# Patient Record
Sex: Female | Born: 1995
Health system: Southern US, Community
[De-identification: ages and names within clinical notes are randomized; demographics above are authoritative.]

## PROBLEM LIST (undated history)

## (undated) DIAGNOSIS — R42 Dizziness and giddiness: Secondary | ICD-10-CM

## (undated) DIAGNOSIS — G43909 Migraine, unspecified, not intractable, without status migrainosus: Secondary | ICD-10-CM

## (undated) HISTORY — DX: Dizziness and giddiness: R42

## (undated) HISTORY — DX: Migraine, unspecified, not intractable, without status migrainosus: G43.909

---

## 2020-04-28 ENCOUNTER — Ambulatory Visit
Admission: EM | Admit: 2020-04-28 | Discharge: 2020-04-28 | Disposition: A | Discharge status: Home / Self Care | Payer: Medicaid Other | Attending: Physician Assistant | Admitting: Physician Assistant

## 2020-04-28 ENCOUNTER — Ambulatory Visit: Admit: 2020-04-28 | Discharge status: Absent without leave | Payer: Self-pay | Source: Home / Self Care

## 2020-04-28 DIAGNOSIS — J029 Acute pharyngitis, unspecified: Secondary | ICD-10-CM

## 2020-04-28 LAB — POCT RAPID STREP A (OFFICE): Rapid Strep A Screen: NEGATIVE

## 2020-04-28 NOTE — Discharge Instructions (Signed)
Return if any problems.

## 2020-04-28 NOTE — ED Triage Notes (Signed)
Pt presents with complaints of nausea, and sore throat. Reports pain is on the left side and her neck is tender to touch. Reports she saw a white area on her left tonsil at home. Reports her stomach was upset last night but she did eat a lot of different things over the holidays.

## 2020-04-30 LAB — COVID-19, FLU A+B NAA
Influenza A, NAA: NOT DETECTED
Influenza B, NAA: NOT DETECTED
SARS-CoV-2, NAA: NOT DETECTED

## 2020-05-03 NOTE — ED Provider Notes (Signed)
[facility]    CSN: [id number] Arrival date & time: 04/28/20  3220      History   Chief Complaint Chief Complaint  Patient presents with  . Sore Throat    HPI Holly Rice is a 25 y.o. female.   The history is provided by the patient. No language interpreter was used.  Sore Throat This is a new problem. The current episode started 2 days ago. The problem occurs constantly. The problem has been gradually worsening. Nothing aggravates the symptoms. Nothing relieves the symptoms. She has tried nothing for the symptoms. The treatment provided no relief.    History reviewed. No pertinent past medical history.  There are no problems to display for this patient.   History reviewed. No pertinent surgical history.  OB History   No obstetric history on file.      Home Medications    Prior to Admission medications   Not on File    Family History History reviewed. No pertinent family history.  Social History Social History   Tobacco Use  . Smoking status: Never Smoker  . Smokeless tobacco: Never Used     Allergies   Aleve [naproxen]   Review of Systems Review of Systems  All other systems reviewed and are negative.    Physical Exam Triage Vital Signs ED Triage Vitals  Enc Vitals Group     BP 04/28/20 0911 104/73     Pulse Rate 04/28/20 0911 90     Resp 04/28/20 0911 15     Temp 04/28/20 0911 99.2 F (37.3 C)     Temp src --      SpO2 04/28/20 0911 98 %     Weight --      Height --      Head Circumference --      Peak Flow --      Pain Score 04/28/20 0919 2     Pain Loc --      Pain Edu? --      Excl. in GC? --    No data found.  Updated Vital Signs BP 104/73   Pulse 90   Temp 99.2 F (37.3 C)   Resp 15   SpO2 98%   Visual Acuity Right Eye Distance:   Left Eye Distance:   Bilateral Distance:    Right Eye Near:   Left Eye Near:    Bilateral Near:     Physical Exam Vitals and nursing note reviewed.   Constitutional:      Appearance: She is well-developed and well-nourished.  HENT:     Head: Normocephalic.     Mouth/Throat:     Mouth: Mucous membranes are moist.     Pharynx: Posterior oropharyngeal erythema present.  Eyes:     Extraocular Movements: EOM normal.  Cardiovascular:     Rate and Rhythm: Normal rate.  Pulmonary:     Effort: Pulmonary effort is normal.  Abdominal:     General: There is no distension.  Musculoskeletal:        General: Normal range of motion.     Cervical back: Normal range of motion.  Neurological:     Mental Status: She is alert and oriented to person, place, and time.  Psychiatric:        Mood and Affect: Mood and affect normal.      UC Treatments / Results  Labs (all labs ordered are listed, but only abnormal results are displayed) Labs Reviewed  COVID-19, FLU A+B NAA  Narrative:    Test(s) 140142-Influenza A, NAA; 140143-Influenza B, NAA was developed and its performance characteristics determined by [facility]. It has not been cleared or approved by the Food and Drug Administration. Performed at:  [address] [place], [place]  [id number] Lab Director: Jolene Schimke MD, Phone:  [phone]  POCT RAPID STREP A (OFFICE)    EKG   Radiology No results found.  Procedures Procedures (including critical care time)  Medications Ordered in UC Medications - No data to display  Initial Impression / Assessment and Plan / UC Course  I have reviewed the triage vital signs and the nursing notes.  Pertinent labs & imaging results that were available during my care of the patient were reviewed by me and considered in my medical decision making (see chart for details).     MDM:  Strep is negative, I suspect viral pharyngitis  Final Clinical Impressions(s) / UC Diagnoses   Final diagnoses:  Viral pharyngitis     Discharge Instructions     Return if any problems   ED Prescriptions    None     PDMP  not reviewed this encounter.  An After Visit Summary was printed and given to the patient.    Elson Areas, [place] 05/03/20 1958

## 2020-09-04 ENCOUNTER — Other Ambulatory Visit: Payer: Self-pay

## 2020-09-04 ENCOUNTER — Ambulatory Visit (INDEPENDENT_AMBULATORY_CARE_PROVIDER_SITE_OTHER): Payer: Medicaid Other

## 2020-09-04 ENCOUNTER — Ambulatory Visit
Admission: EM | Admit: 2020-09-04 | Discharge: 2020-09-04 | Disposition: A | Discharge status: Home / Self Care | Payer: Medicaid Other | Attending: Emergency Medicine | Admitting: Emergency Medicine

## 2020-09-04 DIAGNOSIS — S60450A Superficial foreign body of right index finger, initial encounter: Secondary | ICD-10-CM | POA: Diagnosis not present

## 2020-09-04 DIAGNOSIS — S61259A Open bite of unspecified finger without damage to nail, initial encounter: Secondary | ICD-10-CM

## 2020-09-04 DIAGNOSIS — W5501XA Bitten by cat, initial encounter: Secondary | ICD-10-CM

## 2020-09-04 DIAGNOSIS — L089 Local infection of the skin and subcutaneous tissue, unspecified: Secondary | ICD-10-CM | POA: Diagnosis not present

## 2020-09-04 MED ORDER — AMOXICILLIN-POT CLAVULANATE 875-125 MG PO TABS: 1.0000 | ORAL_TABLET | Freq: Two times a day (BID) | ORAL | 0 refills | Status: AC

## 2020-09-04 MED ORDER — IBUPROFEN 600 MG PO TABS: 600.0000 mg | ORAL_TABLET | Freq: Four times a day (QID) | ORAL | 0 refills | Status: AC | PRN

## 2020-09-04 NOTE — Discharge Instructions (Addendum)
There is no foreign body seen on x-ray.  Take 600 mg of ibuprofen combined with 1000 mg of Tylenol together 3-4 times a day as needed for pain.  There is no foreign body seen on her x-ray.  Finish the Augmentin, even if you feel better.  Go immediately to the emergency department for fevers above 100.4, redness streaking up your hand, pain not controlled with Tylenol/ibuprofen, or for any other concerns.

## 2020-09-04 NOTE — ED Provider Notes (Signed)
HPI  SUBJECTIVE:  Holly Rice is a right-handed 25 y.o. female who presents with pain, erythema, swelling of her right index finger after being bitten by a cat yesterday.  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  She states that this cat has all of its immunizations.  Sustained a bite on the proximal phalanx.  She reports difficulty bending her finger at the PIP secondary to the pain and erythema.  No foreign body sensation, distal numbness or tingling, erythema streaking up the hand.  No body aches, fevers, purulent drainage.  She washed it out with soap and water, has tried alcohol and Betadine without improvement in her symptoms.  Symptoms are worse when she bends her finger.  Her tetanus is up-to-date.  She is not a smoker.  Has no history of diabetes.  LMP: 1 month ago.  Denies possibility being pregnant.  PMD: None.    History reviewed. No pertinent past medical history.  History reviewed. No pertinent surgical history.  Family History  Family history unknown: Yes    Social History   Tobacco Use  . Smoking status: Never Smoker  . Smokeless tobacco: Never Used    No current facility-administered medications for this encounter.  Current Outpatient Medications:  .  amoxicillin-clavulanate (AUGMENTIN) 875-125 MG tablet, Take 1 tablet by mouth 2 (two) times daily for 10 days., Disp: 20 tablet, Rfl: 0 .  ibuprofen (ADVIL) 600 MG tablet, Take 1 tablet (600 mg total) by mouth every 6 (six) hours as needed., Disp: 20 tablet, Rfl: 0  Allergies  Allergen Reactions  . Aleve [Naproxen] Shortness Of Breath     ROS  As noted in HPI.   Physical Exam  BP 109/73   Pulse 74   Temp 98.1 F (36.7 C)   Resp 20   LMP  (LMP Unknown)   SpO2 97%   Constitutional: Well developed, well nourished, no acute distress Eyes:  EOMI, conjunctiva normal bilaterally HENT: Normocephalic, atraumatic,mucus membranes moist Respiratory: Normal inspiratory effort Cardiovascular: Normal rate GI: nondistended skin: See  MSK exam Musculoskeletal: Tender erythema, edema right proximal phalanx of index finger.  Positive puncture wound near the PIP.  No expressible purulent drainage.  Tenderness at the MCP.  No tenderness along the flexor tendon of the hand.  Cap refill distally less than 2 seconds.  Sensation to light touch and temperature grossly intact distally.  Patient able to flex at the DIP, MCP.  Limited flexion at the PIP.       Neurologic: Alert & oriented x 3, no focal neuro deficits Psychiatric: Speech and behavior appropriate   ED Course   Medications - No data to display  Orders Placed This Encounter  Procedures  . DG Finger Index Right    Standing Status:   Standing    Number of Occurrences:   1    Order Specific Question:   Reason for Exam (SYMPTOM  OR DIAGNOSIS REQUIRED)    Answer:   cat bite r/o retained FB    No results found for this or any previous visit (from the past 24 hour(s)). DG Finger Index Right  Result Date: 09/04/2020 CLINICAL DATA:  Cat bite, rule out retained foreign body. EXAM: RIGHT INDEX FINGER 2+V COMPARISON:  None FINDINGS: No radiopaque foreign body in the soft tissues. No acute bone abnormality or sign of dislocation. Soft tissue swelling over the volar aspect of the finger, particularly true over the proximal phalanx of the index finger. IMPRESSION: Soft tissue swelling without acute bone abnormality or radiopaque  foreign body. Electronically Signed   By: Donzetta Kohut M.D.   On: 09/04/2020 12:42    ED Clinical Impression  1. Infected cat bite of finger, initial encounter      ED Assessment/Plan  Will image finger to rule out retained foreign body.  Reviewed imaging independently.  No foreign body.  See radiology report for full details.  Patient status post cat bite to the finger with subsequent infection.  Will send home with Tylenol/ibuprofen.  States that she tolerates ibuprofen without any problem.  10 days Augmentin.  Discussed with her the  unpredictable nature of animal bites, gave her strict ER return precautions.  Will provide primary care list for ongoing care and order assistance in finding a PMD.   Discussed imaging, MDM, treatment plan, and plan for follow-up with patient. Discussed sn/sx that should prompt return to the ED. patient agrees with plan.   Meds ordered this encounter  Medications  . ibuprofen (ADVIL) 600 MG tablet    Sig: Take 1 tablet (600 mg total) by mouth every 6 (six) hours as needed.    Dispense:  20 tablet    Refill:  0  . amoxicillin-clavulanate (AUGMENTIN) 875-125 MG tablet    Sig: Take 1 tablet by mouth 2 (two) times daily for 10 days.    Dispense:  20 tablet    Refill:  0      *This clinic note was created using Scientist, clinical (histocompatibility and immunogenetics). Therefore, there may be occasional mistakes despite careful proofreading.  ?    Domenick Gong, MD 09/05/20 [phone]

## 2020-09-04 NOTE — ED Triage Notes (Signed)
Pt bitten by cat on right index finger yesterday, pt works at [employer], cat is vaccinated. Pt up to date on tetanus

## 2020-09-18 ENCOUNTER — Encounter (HOSPITAL_COMMUNITY): Payer: Self-pay

## 2020-11-09 ENCOUNTER — Ambulatory Visit
Admission: RE | Admit: 2020-11-09 | Discharge: 2020-11-09 | Disposition: A | Discharge status: Home / Self Care | Payer: Medicaid Other | Source: Ambulatory Visit

## 2020-11-09 ENCOUNTER — Other Ambulatory Visit: Payer: Self-pay

## 2020-11-09 VITALS — BP 113/79 | HR 80 | Temp 98.3°F | Resp 20

## 2020-11-09 DIAGNOSIS — G44209 Tension-type headache, unspecified, not intractable: Secondary | ICD-10-CM

## 2020-11-09 DIAGNOSIS — R5383 Other fatigue: Secondary | ICD-10-CM | POA: Diagnosis not present

## 2020-11-09 NOTE — ED Provider Notes (Addendum)
[facility]    CSN: [id number] Arrival date & time: 11/09/20  1053      History   Chief Complaint Chief Complaint  Patient presents with   Nausea   Headache   Fatigue    HPI Holly Rice is a 25 y.o. female.   HPI Headaches, nausea movement  fatigue and stiffness of neck (intermittent).  No difficulty breathing and tired and hot with activity. Nausea and headache over 2 months. Reports  Improved diet. She has taken multiple negative pregnancy tests. No vomiting. Precipitants pf nausea include eating, any activity, and driving. No PCP. No recent complete physical exam  History reviewed. No pertinent past medical history.  There are no problems to display for this patient.   History reviewed. No pertinent surgical history.  OB History   No obstetric history on file.      Home Medications    Prior to Admission medications   Medication Sig Start Date End Date Taking? Authorizing Provider  ibuprofen (ADVIL) 600 MG tablet Take 1 tablet (600 mg total) by mouth every 6 (six) hours as needed. 09/04/20   Domenick Gong, MD    Family History Family History  Family history unknown: Yes    Social History Social History   Tobacco Use   Smoking status: Never   Smokeless tobacco: Never     Allergies   Aleve [naproxen]   Review of Systems Review of Systems Pertinent negatives listed in HPI   Physical Exam Triage Vital Signs ED Triage Vitals  Enc Vitals Group     BP 11/09/20 1119 113/79     Pulse Rate 11/09/20 1119 80     Resp 11/09/20 1119 20     Temp 11/09/20 1119 98.3 F (36.8 C)     Temp src --      SpO2 11/09/20 1119 98 %     Weight --      Height --      Head Circumference --      Peak Flow --      Pain Score 11/09/20 1117 2     Pain Loc --      Pain Edu? --      Excl. in GC? --    No data found.  Updated Vital Signs BP 113/79   Pulse 80   Temp 98.3 F (36.8 C)   Resp 20   LMP 10/22/2020   SpO2 98%   Visual  Acuity Right Eye Distance:   Left Eye Distance:   Bilateral Distance:    Right Eye Near:   Left Eye Near:    Bilateral Near:     Physical Exam BP 113/79   Pulse 80   Temp 98.3 F (36.8 C)   Resp 20   LMP 10/22/2020   SpO2 98%   General Appearance:    Alert, cooperative, no distress, appears stated age  Head:    Normocephalic, without obvious abnormality, atraumatic  Eyes:    PERRL, conjunctiva/corneas clear, EOM's intact, fundi    benign, both eyes  Ears:    Normal TM's and external ear canals, both ears  Nose:   Nares normal, septum midline, mucosa normal, no drainage    or sinus tenderness  Lungs:     Clear to auscultation bilaterally, respirations unlabored   Heart:    Regular rate and rhythm, S1 and S2 normal, no murmur, rub   or gallop  Abdomen:     Soft, non-tender, bowel sounds active all four quadrants,  no masses, no organomegaly  Extremities:   Extremities normal, atraumatic, no cyanosis or edema  Pulses:   2+ and symmetric all extremities  Skin:   Skin color, texture, turgor normal, no rashes or lesions  Neurologic:   CNII-XII intact, normal strength, sensation and reflexes    throughout     UC Treatments / Results  Labs (all labs ordered are listed, but only abnormal results are displayed) Labs Reviewed - No data to display  EKG   Radiology No results found.  Procedures Procedures (including critical care time)  Medications Ordered in UC Medications - No data to display  Initial Impression / Assessment and Plan / UC Course  I have reviewed the triage vital signs and the nursing notes.  Pertinent labs & imaging results that were available during my care of the patient were reviewed by me and considered in my medical decision making (see chart for details).   Multiple idiopathic symptoms. None are acute in nature. Overall physical findings unremarkable and symptoms are out of portion with overall presentation here in clinic. Patient is generally  stable. Needs CPE. Assisted with securing an appointment with PCP. ER precaution given. Patient verbalized understanding and agreement with plan to follow-up with PCP.  Final Clinical Impressions(s) / UC Diagnoses   Final diagnoses:  Fatigue, unspecified type  Tension-type headache, not intractable, unspecified chronicity pattern   Discharge Instructions   None    ED Prescriptions   None    PDMP not reviewed this encounter.   Bing Neighbors, FNP 11/13/20 2033    Bing Neighbors, FNP 11/13/20 2033

## 2020-11-09 NOTE — ED Triage Notes (Signed)
Pt presents with c/o nausea headaches and fatigue for past couple months and recently developed nick stiffness

## 2020-11-15 ENCOUNTER — Encounter (HOSPITAL_COMMUNITY): Payer: Self-pay | Admitting: Emergency Medicine

## 2020-11-15 ENCOUNTER — Other Ambulatory Visit: Payer: Self-pay

## 2020-11-15 ENCOUNTER — Emergency Department (HOSPITAL_COMMUNITY)
Admission: EM | Admit: 2020-11-15 | Discharge: 2020-11-15 | Disposition: A | Discharge status: Home / Self Care | Payer: Medicaid Other | Attending: Emergency Medicine | Admitting: Emergency Medicine

## 2020-11-15 DIAGNOSIS — R5383 Other fatigue: Secondary | ICD-10-CM | POA: Diagnosis present

## 2020-11-15 DIAGNOSIS — F419 Anxiety disorder, unspecified: Secondary | ICD-10-CM | POA: Insufficient documentation

## 2020-11-15 DIAGNOSIS — R110 Nausea: Secondary | ICD-10-CM | POA: Insufficient documentation

## 2020-11-15 DIAGNOSIS — R519 Headache, unspecified: Secondary | ICD-10-CM | POA: Insufficient documentation

## 2020-11-15 DIAGNOSIS — M542 Cervicalgia: Secondary | ICD-10-CM | POA: Diagnosis not present

## 2020-11-15 LAB — I-STAT CHEM 8, ED
BUN: 8 mg/dL (ref 6–20)
Calcium, Ion: 1.29 mmol/L (ref 1.15–1.40)
Chloride: 102 mmol/L (ref 98–111)
Creatinine, Ser: 0.5 mg/dL (ref 0.44–1.00)
Glucose, Bld: 89 mg/dL (ref 70–99)
HCT: 41 % (ref 36.0–46.0)
Hemoglobin: 13.9 g/dL (ref 12.0–15.0)
Potassium: 3.9 mmol/L (ref 3.5–5.1)
Sodium: 140 mmol/L (ref 135–145)
TCO2: 27 mmol/L (ref 22–32)

## 2020-11-15 LAB — TSH: TSH: 1.898 u[IU]/mL (ref 0.350–4.500)

## 2020-11-15 LAB — CBC WITH DIFFERENTIAL/PLATELET
Abs Immature Granulocytes: 0.02 10*3/uL (ref 0.00–0.07)
Basophils Absolute: 0.1 10*3/uL (ref 0.0–0.1)
Basophils Relative: 1 %
Eosinophils Absolute: 0.2 10*3/uL (ref 0.0–0.5)
Eosinophils Relative: 4 %
HCT: 41 % (ref 36.0–46.0)
Hemoglobin: 13.5 g/dL (ref 12.0–15.0)
Immature Granulocytes: 0 %
Lymphocytes Relative: 39 %
Lymphs Abs: 2.1 10*3/uL (ref 0.7–4.0)
MCH: 28.9 pg (ref 26.0–34.0)
MCHC: 32.9 g/dL (ref 30.0–36.0)
MCV: 87.8 fL (ref 80.0–100.0)
Monocytes Absolute: 0.6 10*3/uL (ref 0.1–1.0)
Monocytes Relative: 10 %
Neutro Abs: 2.5 10*3/uL (ref 1.7–7.7)
Neutrophils Relative %: 46 %
Platelets: 284 10*3/uL (ref 150–400)
RBC: 4.67 MIL/uL (ref 3.87–5.11)
RDW: 12.8 % (ref 11.5–15.5)
WBC: 5.5 10*3/uL (ref 4.0–10.5)
nRBC: 0 % (ref 0.0–0.2)

## 2020-11-15 LAB — COMPREHENSIVE METABOLIC PANEL
ALT: 32 U/L (ref 0–44)
AST: 26 U/L (ref 15–41)
Albumin: 4.4 g/dL (ref 3.5–5.0)
Alkaline Phosphatase: 48 U/L (ref 38–126)
Anion gap: 7 (ref 5–15)
BUN: 9 mg/dL (ref 6–20)
CO2: 28 mmol/L (ref 22–32)
Calcium: 9.7 mg/dL (ref 8.9–10.3)
Chloride: 102 mmol/L (ref 98–111)
Creatinine, Ser: 0.51 mg/dL (ref 0.44–1.00)
GFR, Estimated: >60 mL/min (ref >60)
Glucose, Bld: 90 mg/dL (ref 70–99)
Potassium: 3.7 mmol/L (ref 3.5–5.1)
Sodium: 137 mmol/L (ref 135–145)
Total Bilirubin: 0.6 mg/dL (ref 0.3–1.2)
Total Protein: 8.2 g/dL — ABNORMAL HIGH (ref 6.5–8.1)

## 2020-11-15 LAB — I-STAT BETA HCG BLOOD, ED (MC, WL, AP ONLY): I-stat hCG, quantitative: <5 m[IU]/mL (ref <5)

## 2020-11-15 MED ORDER — SODIUM CHLORIDE 0.9 % IV BOLUS
1000.0000 mL | Freq: Once | INTRAVENOUS | Status: AC
  Administered 2020-11-15: 1000 mL via INTRAVENOUS

## 2020-11-15 MED ORDER — ONDANSETRON HCL 4 MG PO TABS: 4.0000 mg | ORAL_TABLET | Freq: Four times a day (QID) | ORAL | 0 refills | Status: DC

## 2020-11-15 NOTE — Discharge Instructions (Addendum)
Please follow up with your primary care provider within 5-7 days for re-evaluation of your symptoms. If you do not have a primary care provider, information for a healthcare clinic has been provided for you to make arrangements for follow up care. Please return to the emergency department for any new or worsening symptoms. ° °

## 2020-11-15 NOTE — ED Provider Notes (Signed)
[facility] EMERGENCY DEPARTMENT Provider Note   CSN: [id number] Arrival date & time: 11/15/20  1346     History Chief Complaint  Patient presents with   Fatigue    Holly Rice is a 25 y.o. female.  HPI  25 year old female presents to the emergency department today for evaluation of multiple complaints.  She states that for the last 3 months she has had multiple different symptoms including nausea, chronic headaches, fatigue, intermittent neck stiffness and lymphadenopathy of the right side of her neck.  She intermittently feels foggy, has shakiness in her hands.  She feels like her symptoms are worse after eating and worse with driving and different movements.  She states that headache is constant and located to her forehead and sometimes behind her eyes.  Ibuprofen seems to help her symptoms.  She rates the pain as 4/10.  She has a history of migraines but has not had issues of migraines for many years.  She denies any fevers, vomiting, diarrhea, constipation, urinary symptoms, vision changes, unilateral numbness or weakness.  No ataxia.  She does report that she has had increased anxiety and depressive symptoms recently.  She denies SI.  Denies any recent medication changes, dietary changes or environmental changes that could have triggered symptoms.  She tried to get an appoint with her PCP but was unable to get an expedited appointment.  History reviewed. No pertinent past medical history.  There are no problems to display for this patient.   History reviewed. No pertinent surgical history.   OB History   No obstetric history on file.     Family History  Family history unknown: Yes    Social History   Tobacco Use   Smoking status: Never   Smokeless tobacco: Never  Substance Use Topics   Alcohol use: Yes   Drug use: Never    Home Medications Prior to Admission medications   Medication Sig Start Date End Date Taking? Authorizing Provider  NIKKI 3-0.02 MG tablet  Take 1 tablet by mouth daily. 10/28/20  Yes [provider]  ondansetron (ZOFRAN) 4 MG tablet Take 1 tablet (4 mg total) by mouth every 6 (six) hours. 11/15/20  Yes Tarin Navarez S, PA-C  sertraline (ZOLOFT) 50 MG tablet Take 50 mg by mouth daily. 09/16/20  Yes [provider]  ibuprofen (ADVIL) 600 MG tablet Take 1 tablet (600 mg total) by mouth every 6 (six) hours as needed. Patient not taking: Reported on 11/15/2020 09/04/20   Domenick Gong, MD    Allergies    Aleve [naproxen]  Review of Systems   Review of Systems  Constitutional:  Positive for fatigue. Negative for fever.  HENT:  Negative for ear pain and sore throat.   Eyes:  Negative for visual disturbance.  Respiratory:  Negative for cough and shortness of breath.   Cardiovascular:  Negative for chest pain.  Gastrointestinal:  Positive for nausea. Negative for abdominal pain, constipation, diarrhea and vomiting.  Genitourinary:  Negative for dysuria and hematuria.  Musculoskeletal:  Negative for back pain.  Skin:  Negative for rash.  Neurological:  Positive for headaches. Negative for dizziness, weakness, light-headedness and numbness.  All other systems reviewed and are negative.  Physical Exam Updated Vital Signs BP 101/71   Pulse 71   Temp 98.6 F (37 C) (Oral)   Resp 14   Ht 5' (1.524 m)   Wt 57.2 kg   LMP 10/22/2020   SpO2 100%   BMI 24.61 kg/m   Physical Exam  Vitals and nursing note reviewed.  Constitutional:      General: She is not in acute distress.    Appearance: She is well-developed.  HENT:     Head: Normocephalic and atraumatic.  Eyes:     Extraocular Movements: Extraocular movements intact.     Conjunctiva/sclera: Conjunctivae normal.     Pupils: Pupils are equal, round, and reactive to light.  Cardiovascular:     Rate and Rhythm: Normal rate and regular rhythm.     Heart sounds: Normal heart sounds. No murmur heard. Pulmonary:     Effort: Pulmonary effort is normal. No  respiratory distress.     Breath sounds: Normal breath sounds. No wheezing, rhonchi or rales.  Abdominal:     General: Bowel sounds are normal.     Palpations: Abdomen is soft.     Tenderness: There is no abdominal tenderness. There is no guarding or rebound.  Musculoskeletal:     Cervical back: Neck supple.  Skin:    General: Skin is warm and dry.  Neurological:     Mental Status: She is alert.     Comments: Mental Status:  Alert, thought content appropriate, able to give a coherent history. Speech fluent without evidence of aphasia. Able to follow 2 step commands without difficulty.  Cranial Nerves:  II:  pupils equal, round, reactive to light III,IV, VI: ptosis not present, extra-ocular motions intact bilaterally  V,VII: smile symmetric, facial light touch sensation equal VIII: hearing grossly normal to voice  X: uvula elevates symmetrically  XI: bilateral shoulder shrug symmetric and strong XII: midline tongue extension without fassiculations Motor:  Normal tone. 5/5 strength of BUE and BLE major muscle groups including strong and equal grip strength and dorsiflexion/plantar flexion Sensory: light touch normal in all extremities. Cerebellar: normal finger-to-nose with bilateral upper extremities      ED Results / Procedures / Treatments   Labs (all labs ordered are listed, but only abnormal results are displayed) Labs Reviewed  COMPREHENSIVE METABOLIC PANEL - Abnormal; Notable for the following components:      Result Value   Total Protein 8.2 (*)    All other components within normal limits  CBC WITH DIFFERENTIAL/PLATELET  TSH  I-STAT BETA HCG BLOOD, ED ([facility], [facility], [facility] ONLY)  I-STAT CHEM 8, ED    EKG EKG Interpretation  Date/Time:  Sunday November 15 2020 15:31:14 EDT Ventricular Rate:  65 PR Interval:  135 QRS Duration: 83 QT Interval:  403 QTC Calculation: 419 R Axis:   74 Text Interpretation: Sinus rhythm Borderline T abnormalities, anterior leads No significant  change since last tracing Confirmed by Sheldon, Charles ([id number]) on 11/15/2020 3:42:35 PM  Radiology No results found.  Procedures Procedures   Medications Ordered in ED Medications  sodium chloride 0.9 % bolus 1,000 mL (0 mLs Intravenous Stopped 11/15/20 1709)    ED Course  I have reviewed the triage vital signs and the nursing notes.  Pertinent labs & imaging results that were available during my care of the patient were reviewed by me and considered in my medical decision making (see chart for details).    MDM Rules/Calculators/A&P                          24  y/o female here with multiple complaints including headache, fatigue, nausea.   Reviewed/interpreted labs CBC is unremarkable CMP is unremarkable TSH wnl Beta hcg neg  UA neg  EKG - Sinus rhythm Borderline T abnormalities, anterior leads  No significant change since last tracing  Sxs are subacute in nature and at this time I do not have a unifying diagnosis to explain her symptoms however I am not seeing any evidence of infection, significant electrolyte derangements, kidney impairment, liver impairment, anemia, thyroid disease or other metabolic cause of symptoms.  Her neurologic exam today is benign and we discussed the pros/cons of obtaining CT imaging of the head at this time.  We had a shared decision-making discussion about this and she feels comfortable waiting for her follow-up appoint with neurology before pursuing imaging which I hink is reasonable.  She understands the need to return to the ED for evaluation if her symptoms were to progress.  I did advise to follow-up with her PCP in regards to symptoms and she voiced understanding and is in agreement.  All questions were answered, patient appears to be stable for discharge.  Final Clinical Impression(s) / ED Diagnoses Final diagnoses:  Fatigue, unspecified type    Rx / DC Orders ED Discharge Orders          Ordered    ondansetron (ZOFRAN) 4 MG tablet  Every  6 hours        11/15/20 1639             Annakate Soulier S, PA-C 11/15/20 1712    Eber Hong, MD 11/19/20 [phone]

## 2020-11-15 NOTE — ED Triage Notes (Signed)
Pt presents today with multiple complaints. Pt reports headaches, fatigue, and nausea x 2 months. Was seen at urgent care for same about 5 days and was referred to neurology. Pt also reports difficulty with speaking at times and weakness (dropping things) within the last week, and stumbling over her feet. States symptoms seem significantly worse today.

## 2020-11-26 ENCOUNTER — Telehealth: Payer: Self-pay

## 2020-11-26 ENCOUNTER — Emergency Department (HOSPITAL_COMMUNITY)
Admission: EM | Admit: 2020-11-26 | Discharge: 2020-11-26 | Disposition: A | Discharge status: Home / Self Care | Payer: Medicaid Other | Attending: Emergency Medicine | Admitting: Emergency Medicine

## 2020-11-26 ENCOUNTER — Other Ambulatory Visit: Payer: Self-pay

## 2020-11-26 ENCOUNTER — Encounter: Payer: Self-pay | Admitting: Registered Nurse

## 2020-11-26 ENCOUNTER — Ambulatory Visit: Payer: Medicaid Other | Admitting: Registered Nurse

## 2020-11-26 ENCOUNTER — Encounter (HOSPITAL_COMMUNITY): Payer: Self-pay | Admitting: *Deleted

## 2020-11-26 VITALS — BP 118/74 | HR 82 | Temp 98.0°F | Resp 18 | Ht 60.0 in | Wt 127.4 lb

## 2020-11-26 DIAGNOSIS — R519 Headache, unspecified: Secondary | ICD-10-CM

## 2020-11-26 DIAGNOSIS — Z7689 Persons encountering health services in other specified circumstances: Secondary | ICD-10-CM | POA: Diagnosis not present

## 2020-11-26 DIAGNOSIS — R5382 Chronic fatigue, unspecified: Secondary | ICD-10-CM | POA: Diagnosis not present

## 2020-11-26 DIAGNOSIS — T50902A Poisoning by unspecified drugs, medicaments and biological substances, intentional self-harm, initial encounter: Secondary | ICD-10-CM

## 2020-11-26 DIAGNOSIS — R259 Unspecified abnormal involuntary movements: Secondary | ICD-10-CM | POA: Diagnosis not present

## 2020-11-26 DIAGNOSIS — Z5321 Procedure and treatment not carried out due to patient leaving prior to being seen by health care provider: Secondary | ICD-10-CM | POA: Diagnosis not present

## 2020-11-26 DIAGNOSIS — R29818 Other symptoms and signs involving the nervous system: Secondary | ICD-10-CM

## 2020-11-26 DIAGNOSIS — G4452 New daily persistent headache (NDPH): Secondary | ICD-10-CM

## 2020-11-26 DIAGNOSIS — G8929 Other chronic pain: Secondary | ICD-10-CM

## 2020-11-26 DIAGNOSIS — R413 Other amnesia: Secondary | ICD-10-CM | POA: Diagnosis not present

## 2020-11-26 LAB — CBC WITH DIFFERENTIAL/PLATELET
Abs Immature Granulocytes: 0.02 10*3/uL (ref 0.00–0.07)
Basophils Absolute: 0.1 10*3/uL (ref 0.0–0.1)
Basophils Absolute: 0.1 10*3/uL (ref 0.0–0.1)
Basophils Relative: 0.9 % (ref 0.0–3.0)
Basophils Relative: 1 %
Eosinophils Absolute: 0.2 10*3/uL (ref 0.0–0.7)
Eosinophils Absolute: 0.2 10*3/uL (ref 0.0–0.5)
Eosinophils Relative: 2.5 % (ref 0.0–5.0)
Eosinophils Relative: 3 %
HCT: 38.9 % (ref 36.0–46.0)
HCT: 41.9 % (ref 36.0–46.0)
Hemoglobin: 13.2 g/dL (ref 12.0–15.0)
Hemoglobin: 13.9 g/dL (ref 12.0–15.0)
Immature Granulocytes: 0 %
Lymphocytes Relative: 33 % (ref 12.0–46.0)
Lymphocytes Relative: 32 %
Lymphs Abs: 2 10*3/uL (ref 0.7–4.0)
Lymphs Abs: 2.2 10*3/uL (ref 0.7–4.0)
MCH: 28.9 pg (ref 26.0–34.0)
MCHC: 33.8 g/dL (ref 30.0–36.0)
MCHC: 33.2 g/dL (ref 30.0–36.0)
MCV: 84.7 fl (ref 78.0–100.0)
MCV: 87.1 fL (ref 80.0–100.0)
Monocytes Absolute: 0.5 10*3/uL (ref 0.1–1.0)
Monocytes Absolute: 0.8 10*3/uL (ref 0.1–1.0)
Monocytes Relative: 8.4 % (ref 3.0–12.0)
Monocytes Relative: 12 %
Neutro Abs: 3.4 10*3/uL (ref 1.4–7.7)
Neutro Abs: 3.7 10*3/uL (ref 1.7–7.7)
Neutrophils Relative %: 55.2 % (ref 43.0–77.0)
Neutrophils Relative %: 52 %
Platelets: 279 10*3/uL (ref 150.0–400.0)
Platelets: 287 10*3/uL (ref 150–400)
RBC: 4.6 Mil/uL (ref 3.87–5.11)
RBC: 4.81 MIL/uL (ref 3.87–5.11)
RDW: 13.2 % (ref 11.5–15.5)
RDW: 13.1 % (ref 11.5–15.5)
WBC: 6.2 10*3/uL (ref 4.0–10.5)
WBC: 7 10*3/uL (ref 4.0–10.5)
nRBC: 0 % (ref 0.0–0.2)

## 2020-11-26 LAB — COMPREHENSIVE METABOLIC PANEL
ALT: 28 U/L (ref 0–35)
ALT: 31 U/L (ref 0–44)
AST: 21 U/L (ref 0–37)
AST: 24 U/L (ref 15–41)
Albumin: 4.6 g/dL (ref 3.5–5.2)
Albumin: 4.5 g/dL (ref 3.5–5.0)
Alkaline Phosphatase: 38 U/L — ABNORMAL LOW (ref 39–117)
Alkaline Phosphatase: 43 U/L (ref 38–126)
Anion gap: 9 (ref 5–15)
BUN: 9 mg/dL (ref 6–23)
BUN: 10 mg/dL (ref 6–20)
CO2: 25 mEq/L (ref 19–32)
CO2: 24 mmol/L (ref 22–32)
Calcium: 9.9 mg/dL (ref 8.4–10.5)
Calcium: 9.5 mg/dL (ref 8.9–10.3)
Chloride: 103 mEq/L (ref 96–112)
Chloride: 105 mmol/L (ref 98–111)
Creatinine, Ser: 0.6 mg/dL (ref 0.40–1.20)
Creatinine, Ser: 0.55 mg/dL (ref 0.44–1.00)
GFR, Estimated: >60 mL/min (ref >60)
GFR: 125.38 mL/min (ref >60.00)
Glucose, Bld: 80 mg/dL (ref 70–99)
Glucose, Bld: 85 mg/dL (ref 70–99)
Potassium: 4 mEq/L (ref 3.5–5.1)
Potassium: 3.6 mmol/L (ref 3.5–5.1)
Sodium: 138 mEq/L (ref 135–145)
Sodium: 138 mmol/L (ref 135–145)
Total Bilirubin: 0.2 mg/dL (ref 0.2–1.2)
Total Bilirubin: 0.4 mg/dL (ref 0.3–1.2)
Total Protein: 7.6 g/dL (ref 6.0–8.3)
Total Protein: 7.8 g/dL (ref 6.5–8.1)

## 2020-11-26 LAB — B12 AND FOLATE PANEL
Folate: >24.4 ng/mL (ref >5.9)
Vitamin B-12: 227 pg/mL (ref 211–911)

## 2020-11-26 LAB — MAGNESIUM: Magnesium: 2 mg/dL (ref 1.7–2.4)

## 2020-11-26 LAB — URINALYSIS
Bilirubin Urine: NEGATIVE
Hgb urine dipstick: NEGATIVE
Ketones, ur: NEGATIVE
Leukocytes,Ua: NEGATIVE
Nitrite: NEGATIVE
Specific Gravity, Urine: 1.02 (ref 1.000–1.030)
Total Protein, Urine: NEGATIVE
Urine Glucose: NEGATIVE
Urobilinogen, UA: 0.2 (ref 0.0–1.0)
pH: 7.5 (ref 5.0–8.0)

## 2020-11-26 LAB — VITAMIN D 25 HYDROXY (VIT D DEFICIENCY, FRACTURES): VITD: 25.2 ng/mL — ABNORMAL LOW (ref 30.00–100.00)

## 2020-11-26 LAB — T4, FREE: Free T4: 0.7 ng/dL (ref 0.60–1.60)

## 2020-11-26 LAB — C-REACTIVE PROTEIN: CRP: <1 mg/dL (ref 0.5–20.0)

## 2020-11-26 LAB — SEDIMENTATION RATE: Sed Rate: 5 mm/hr (ref 0–20)

## 2020-11-26 LAB — TSH: TSH: 3.208 u[IU]/mL (ref 0.350–4.500)

## 2020-11-26 NOTE — ED Triage Notes (Signed)
Referred here for MRI DUE TO HEADACHES AND NEUROLOGICAL ISSUES

## 2020-11-26 NOTE — Patient Instructions (Addendum)
Holly Rice -   Holly Rice to meet you. Let's figure out what's happening!  I have ordered CT scan of head w contrast - the imaging center will call you to get this coordinated.  Lab results from today will roll in over the next couple of days. I'll be in touch with results. They will also appear on MyChart.   I will push to get you in with a neurologist ASAP - hopefully by the end of next week - and do what I can to help coordinate a neurologist in [place] for continuity.   We'll speak soon -   Rich     If you have lab work done today you will be contacted with your lab results within the next 2 weeks.  If you have not heard from [place] then please contact us. The fastest way to get your results is to register for My Chart.   IF you received an x-ray today, you will receive an invoice from [facility]. Please contact [facility] at [phone] with questions or concerns regarding your invoice.   IF you received labwork today, you will receive an invoice from [facility]. Please contact [facility] at [phone] with questions or concerns regarding your invoice.   Our billing staff will not be able to assist you with questions regarding bills from these companies.  You will be contacted with the lab results as soon as they are available. The fastest way to get your results is to activate your My Chart account. Instructions are located on the last page of this paperwork. If you have not heard from [place] regarding the results in 2 weeks, please contact this office.

## 2020-11-26 NOTE — Telephone Encounter (Signed)
Holly Rice called back she went to [facility] to have MRI done and they would not do the MRI past a certain time and the paperwork showing a CT Scan with head w/contrast. Holly Rice said he was going to skip CT and go for MRI. Holly Rice from referrals told her it willl take weeks to get approved with Medicaid and for her to go to the ER to have MRI done she could get it done quicker there. The Dr at [facility] would not do it said she does not do MRI past 5 that she did not look like she was having a stroke and that there was 5 people in front of her. Holly Rice is telling her to go to [facility] and she is needing paperwork to with proper orders on it.    Pt call back [phone]

## 2020-11-26 NOTE — Progress Notes (Signed)
New Patient Office Visit  Subjective:  Patient ID: Holly Rice, female    DOB: Apr 06, 1996  Age: 25 y.o. MRN: [id number]  CC:  Chief Complaint  Patient presents with   Fatigue    Patient states she has been Fatigue and having some migraines for about three months on and off. But has been consentient. Patient states some days she has noticed movement makes it works. Patient wanted to also discuss her movements after taking more medication then she should have.    HPI Holly Rice presents for visit to est care.  Concerned for chronic fatigue and headache Slow onset over past 2-3 mo Not improving or worsening.  Per ED notes earlier in July, had unremarkable neuro exam and labs. Personally reviewed labs and notes.   Histories reviewed and updated with patient.   Migraines: On and off whole life Has seen chiropractor throughout teen years and through college For past 2 years had break until past 2-3 mo Sometimes headache, sometimes migraine Migraine takes away function - can operate through headaches Ibuprofen has helped in past. Sometimes with tylenol. Does not eliminate headache. Does get some visual changes before and during headache Does note some weakness in hands  Nausea onset 3.5 mo ago, before headaches. Negative pregnancy test, negative covid test.   Fatigue Fairly sudden onset - had gone to work, pushed through  [employer], crashed - very sick, no appetite Able to push through work but "pays for it" with illness. ADLs very exhausting. Sleeping very heavily, which is new. Snoring, which is new. Very groggy, which is new.  Intentional Drug Overdose Had flexeril rx for pinched nerve around 2 years ago Mid June - was also experiencing "irrational behavior", strong negative feelings. Very prone to spiraling emotionally Had a fairly negative conversation with boyfriend, took whole bottle of flexeril - guessing around 15 pills of flexeril, unsure of 5 or 10mg   dose. "Tripping" for 2 days, then back to previous fatigue and migraines, and then 2 weeks later had onset of a variety of neuro symptoms including twitching, weakness, inappropriate reaction to stimuli, flinching very easily, very jumpy, jumbles words, near stutter (new)   Seizure like activity Notes isolated episode around 2 weeks ago when her hands clasped together and she could not move them Tension in muscle throughout body Lasted 10-15 seconds Reports post ictal type symptoms afterwards - fatigue, trouble word finding, nausea, confusion   Otherwise: Notes neuro symptoms more on L side than R  Works at - has been there 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 Multiple animal bites in the past - confident all animals were vaccinated that may have bit her.   Did note diarrhea towards beginning of symptoms 2-3 mos ago, thought maybe lactose intolerance but resolved on its own.  No sudden weight changes.  R hand dominant.  No red flags of: onset in minutes, no back pain or weight loss, no fam hx of neuro disorder, no acute visual changes or eye pain, ophthalmoplegia or nystagmus,   Wears glasses when driving - should wear them all the time, doesn't - rx has not been updated in a number of years.   Past Medical History:  Diagnosis Date   Migraines    Vertigo     History reviewed. No pertinent surgical history.  Family History  Problem Relation Age of Onset   Migraines Maternal Grandmother    Diabetes Maternal Uncle    Obesity Maternal Uncle     Social History   Socioeconomic  History   Marital status: Significant Other    Spouse name: Not on file   Number of children: Not on file   Years of education: Not on file   Highest education level: Not on file  Occupational History   Not on file  Tobacco Use   Smoking status: Never   Smokeless tobacco: Never  Vaping Use   Vaping Use: Never used  Substance and Sexual Activity   Alcohol use: Yes    Alcohol/week: 2.0 standard drinks     Types: 2 Standard drinks or equivalent per week   Drug use: Never   Sexual activity: Yes    Birth control/protection: OCP  Other Topics Concern   Not on file  Social History Narrative   Pregnant Nov 2020-Aug 2021 - healthy natural birth.   Social Determinants of Health   Financial Resource Strain: Not on file  Food Insecurity: Not on file  Transportation Needs: Not on file  Physical Activity: Not on file  Stress: Not on file  Social Connections: Not on file  Intimate Partner Violence: Not on file    ROS Review of Systems  Constitutional:  Positive for appetite change and fatigue. Negative for activity change, chills, diaphoresis, fever and unexpected weight change.  HENT: Negative.    Eyes: Negative.   Respiratory: Negative.    Cardiovascular: Negative.  Negative for chest pain, palpitations and leg swelling.  Gastrointestinal: Negative.   Endocrine: Negative.   Genitourinary: Negative.   Musculoskeletal: Negative.   Skin: Negative.   Allergic/Immunologic: Negative.   Neurological:  Positive for dizziness, tremors, speech difficulty, weakness, numbness and headaches. Negative for seizures, syncope, facial asymmetry and light-headedness.  Psychiatric/Behavioral:  Positive for agitation, dysphoric mood, self-injury and suicidal ideas. The patient is nervous/anxious.    Objective:   Today's Vitals: BP 118/74   Pulse 82   Temp 98 F (36.7 C) (Temporal)   Resp 18   Ht 5' (1.524 m)   Wt 127 lb 6.4 oz (57.8 kg)   SpO2 99%   BMI 24.88 kg/m   Physical Exam Vitals and nursing note reviewed.  Constitutional:      General: She is not in acute distress.    Appearance: Normal appearance. She is not ill-appearing, toxic-appearing or diaphoretic.  Eyes:     General: Lids are normal. No visual field deficit or scleral icterus.    Extraocular Movements: Extraocular movements intact.     Conjunctiva/sclera: Conjunctivae normal.     Pupils: Pupils are equal, round, and reactive  to light.     Right eye: Pupil is round, reactive and not sluggish. No corneal abrasion or fluorescein uptake. Seidel exam negative.     Left eye: Pupil is round, reactive and not sluggish. No corneal abrasion or fluorescein uptake. Seidel exam negative.    Funduscopic exam:    Right eye: No hemorrhage, exudate, AV nicking, arteriolar narrowing or papilledema. Red reflex and venous pulsations present.        Left eye: No hemorrhage, exudate, AV nicking, arteriolar narrowing or papilledema. Red reflex and venous pulsations present. Cardiovascular:     Rate and Rhythm: Normal rate and regular rhythm.     Pulses: Normal pulses.     Heart sounds: Normal heart sounds. No murmur heard.   No friction rub. No gallop.  Pulmonary:     Effort: Pulmonary effort is normal. No respiratory distress.     Breath sounds: Normal breath sounds. No stridor. No wheezing, rhonchi or rales.  Chest:  Chest wall: No tenderness.  Abdominal:     General: Abdomen is flat. Bowel sounds are normal.  Skin:    General: Skin is warm and dry.     Capillary Refill: Capillary refill takes less than 2 seconds.  Neurological:     General: No focal deficit present.     Mental Status: She is alert and oriented to person, place, and time. Mental status is at baseline.     GCS: GCS eye subscore is 4. GCS verbal subscore is 5. GCS motor subscore is 6.     Cranial Nerves: Cranial nerves are intact. No dysarthria or facial asymmetry.     Sensory: Sensation is intact.     Motor: Motor function is intact.     Coordination: Coordination is intact.     Gait: Gait is intact. Gait and tandem walk normal.     Deep Tendon Reflexes: Reflexes normal.  Psychiatric:        Mood and Affect: Mood normal.        Behavior: Behavior normal.        Thought Content: Thought content normal.        Judgment: Judgment normal.    Assessment & Plan:   Problem List Items Addressed This Visit   None Visit Diagnoses     Chronic fatigue    -   Primary   Relevant Orders   Antinuclear Antib (ANA)   Sedimentation rate   C-reactive protein   Vitamin D (25 hydroxy)   B12 and Folate Panel   CBC with Differential/Platelet   Comprehensive metabolic panel   T4, free   Iron, TIBC and Ferritin Panel   Urinalysis   Chronic nonintractable headache, unspecified headache type       Relevant Orders   Ambulatory referral to Neurology   Antinuclear Antib (ANA)   Sedimentation rate   C-reactive protein   Vitamin D (25 hydroxy)   B12 and Folate Panel   Encounter to establish care       Relevant Orders   Antinuclear Antib (ANA)   Sedimentation rate   C-reactive protein   Vitamin D (25 hydroxy)   B12 and Folate Panel   New daily persistent headache       Relevant Orders   Ambulatory referral to Neurology   MR Brain W Wo Contrast   Antinuclear Antib (ANA)   Sedimentation rate   C-reactive protein   Vitamin D (25 hydroxy)   B12 and Folate Panel   Urinalysis   Abnormal movements       Relevant Orders   Ambulatory referral to Neurology   MR Brain W Wo Contrast   Antinuclear Antib (ANA)   Sedimentation rate   C-reactive protein   Vitamin D (25 hydroxy)   B12 and Folate Panel   Urinalysis   Short-term memory loss       Relevant Orders   Ambulatory referral to Neurology   MR Brain W Wo Contrast   HIV antibody (with reflex)   RPR   Antinuclear Antib (ANA)   Sedimentation rate   C-reactive protein   Vitamin D (25 hydroxy)   B12 and Folate Panel   Urinalysis   Acute drug overdose, intentional self-harm, initial encounter (HCC)       Relevant Orders   Antinuclear Antib (ANA)   Sedimentation rate   C-reactive protein   Vitamin D (25 hydroxy)   B12 and Folate Panel   Urinalysis   Ambulatory referral to Psychology   Neurologic abnormality  Relevant Orders   Ambulatory referral to Neurology   MR Brain W Wo Contrast   Antinuclear Antib (ANA)   Sedimentation rate   C-reactive protein   Vitamin D (25 hydroxy)   B12  and Folate Panel   Urinalysis       Outpatient Encounter Medications as of 11/26/2020  Medication Sig   NIKKI 3-0.02 MG tablet Take 1 tablet by mouth daily.   ibuprofen (ADVIL) 600 MG tablet Take 1 tablet (600 mg total) by mouth every 6 (six) hours as needed. (Patient not taking: No sig reported)   ondansetron (ZOFRAN) 4 MG tablet Take 1 tablet (4 mg total) by mouth every 6 (six) hours. (Patient not taking: Reported on 11/26/2020)   sertraline (ZOLOFT) 50 MG tablet Take 50 mg by mouth daily. (Patient not taking: Reported on 11/26/2020)   No facility-administered encounter medications on file as of 11/26/2020.    Follow-up: Return in about 4 weeks (around 12/24/2020) for check on conditions.   PLAN Patient has a number of concerning but nonspecific symptoms. Will pursue first with referral to neuro and MRI brain wwo to help rule out neuro degenerative changes or mass effect. Labs today as well to check on a variety of conditions. While some of the more recent symptoms may be related to intentional drug overdose, preceding symptoms still of concern.  Will refer to counseling and consider medication intervention for anxiety and depression going forward.  Patient encouraged to call clinic with any questions, comments, or concerns.  I spent 65 minutes with this patient reviewing history, current concerns, coordinating stat imaging and urgent referral, and reviewing lab work pursued today.  Janeece Ageeichard Athalene Kolle, NP

## 2020-11-26 NOTE — ED Provider Notes (Signed)
Emergency Medicine Provider Triage Evaluation Note  Holly Rice , a 25 y.o. female  was evaluated in triage.  Pt complains of muscle tremors, twitches, weakness x4 months.  States she was supposed to be seen by a neurologist today, but the appointment was actually with a neurologist.  He ordered an MRI stat and sent her with a referral to neurology.    Per chart review, the MRI was ordered outpatient and the note does not mention anything about emergent MRI being needed.  She has not had any head trauma, no changes in the last 4 months other than this weakness.  She is very concerned given that she is feeling weak and having issues taking care of her child at home.    After asking how long it would take for an MRI, Patient was advised that a CT would be the starting point as well as blood work. I told her that our MRI leaves at 7 and we will do our best to do it if we think it is necessary when she is evaluated in the back more thoroughly as I do not feel comfortable evaluating that need during screening. Patient states she needs the MRI urgently, does not want to do it outpatient or do the CT scan first.  Patient is very reasonable, but I am not entirely clear that she is understanding me.  Told her we can do the work-up here and possibly start with a CT scan, patient is reiterating she needs an MRI done.  Discussed that this is a screening exam and more further evaluation will be needed in the back, but patient states she is going to call her physician about when she needs to have the MRI done and and we will try to schedule it outpatient today so she does not have to wait.  PE: Neuro: Cranial nerves II through XII were grossly intact.  Good strength is equal bilaterally.  Able to ambulate without concerns.  She responds appropriately to questions.  No focal deficits were noted during my neurologic exam.  However, this is limited.  Medically screening exam initiated at 4:24 PM.  Appropriate orders  placed.  Ikran Patman was informed that the remainder of the evaluation will be completed by another provider, this initial triage assessment does not replace that evaluation, and the importance of remaining in the ED until their evaluation is complete.     Theron Arista, PA-C 11/26/20 1632    Maia Plan, MD 11/30/20 1259

## 2020-11-27 ENCOUNTER — Emergency Department (HOSPITAL_COMMUNITY): Payer: Medicaid Other

## 2020-11-27 ENCOUNTER — Emergency Department (HOSPITAL_COMMUNITY)
Admission: EM | Admit: 2020-11-27 | Discharge: 2020-11-27 | Disposition: A | Discharge status: Home / Self Care | Payer: Medicaid Other | Attending: Emergency Medicine | Admitting: Emergency Medicine

## 2020-11-27 ENCOUNTER — Encounter (HOSPITAL_COMMUNITY): Payer: Self-pay

## 2020-11-27 DIAGNOSIS — R519 Headache, unspecified: Secondary | ICD-10-CM | POA: Diagnosis present

## 2020-11-27 DIAGNOSIS — R258 Other abnormal involuntary movements: Secondary | ICD-10-CM

## 2020-11-27 DIAGNOSIS — R259 Unspecified abnormal involuntary movements: Secondary | ICD-10-CM | POA: Insufficient documentation

## 2020-11-27 DIAGNOSIS — E348 Other specified endocrine disorders: Secondary | ICD-10-CM | POA: Insufficient documentation

## 2020-11-27 LAB — IRON,TIBC AND FERRITIN PANEL
%SAT: 19 % (calc) (ref 16–45)
Ferritin: 14 ng/mL — ABNORMAL LOW (ref 16–154)
Iron: 98 ug/dL (ref 40–190)
TIBC: 528 mcg/dL (calc) — ABNORMAL HIGH (ref 250–450)

## 2020-11-27 LAB — RPR: RPR Ser Ql: NONREACTIVE

## 2020-11-27 LAB — HIV ANTIBODY (ROUTINE TESTING W REFLEX): HIV 1&2 Ab, 4th Generation: NONREACTIVE

## 2020-11-27 LAB — ANA: Anti Nuclear Antibody (ANA): NEGATIVE

## 2020-11-27 MED ORDER — LORAZEPAM 2 MG/ML IJ SOLN
0.5000 mg | Freq: Once | INTRAMUSCULAR | Status: AC
  Administered 2020-11-27: 0.5 mg via INTRAVENOUS
  Filled 2020-11-27: qty 1

## 2020-11-27 MED ORDER — GADOBUTROL 1 MMOL/ML IV SOLN
5.7000 mL | Freq: Once | INTRAVENOUS | Status: AC | PRN
  Administered 2020-11-27: 5.7 mL via INTRAVENOUS

## 2020-11-27 MED ORDER — LORAZEPAM 1 MG PO TABS
1.0000 mg | ORAL_TABLET | Freq: Once | ORAL | Status: AC
  Administered 2020-11-27: 1 mg via ORAL
  Filled 2020-11-27: qty 1

## 2020-11-27 NOTE — ED Provider Notes (Signed)
Emergency Medicine Provider Triage Evaluation Note  Holly Rice , a 25 y.o. female  was evaluated in triage.  Pt complains of muscle twitching and tremors for several months. Seen by pcp and scheduled for mri. Here for mri.  Labs completed recently at [facility]  Review of Systems  Positive: Muscle twitching, fatigue, nausea Negative: Vomiting  Physical Exam  BP 113/74 (BP Location: Left Arm)   Pulse 91   Temp 98 F (36.7 C) (Oral)   Resp 16   LMP 10/28/2020   SpO2 98%  Gen:   Awake, no distress   Resp:  Normal effort  MSK:   Moves extremities without difficulty   Medical Decision Making  Medically screening exam initiated at 2:03 PM.  Appropriate orders placed.  Holly Rice was informed that the remainder of the evaluation will be completed by another provider, this initial triage assessment does not replace that evaluation, and the importance of remaining in the ED until their evaluation is complete.   Karrie Meres, PA-C 11/27/20 1405    Cathren Laine, MD 11/28/20 1258

## 2020-11-27 NOTE — ED Provider Notes (Signed)
[facility] EMERGENCY DEPARTMENT Provider Note   CSN: [id number] Arrival date & time: 11/27/20  1316     History Chief Complaint  Patient presents with   Headache    Holly Rice is a 25 y.o. female.  25 year old female with hx of migraines and vertigo for many years but here today for request of MRI.  Patient was seen earlier this week in the emergency room and had lab test done and followed up with her PCP and they wanted to do an MRI for further evaluation and she was sent here to get that expedited as it was going to take weeks to get done in the outpatient setting.  She states that for the last 4 months she has had multiple different symptoms including nausea, chronic headaches, fatigue, intermittent neck stiffness and involuntary jerking.  She intermittently feels foggy, has shakiness in her hands.  She feels like her symptoms are worse with driving and different movements.  She states that headache is constant and located to her forehead and sometimes behind her eyes.  Ibuprofen seems to help her symptoms when the headache is mild.  She rates the pain as 2/10.  She also reports that sometimes her emotions are out of control and she does things erratically that she cannot explain.  Last month she overdosed on muscle relaxers after she had been drinking which she reports is definitely not like herself.  She did not seek medical care at that time and did not see anybody for mental health.  She denies any fevers, vomiting, diarrhea, weight loss, urinary symptoms, vision changes, unilateral numbness or weakness.  No ataxia.  She denies SI.  The most bothersome thing to her now is the chronic fatigue and the involuntary jerking that she reports it only happens while she is awake and people have reported she does not have it with sleeping.  She denies any tick exposure or high risk for exposure.  Patient currently is not taking any medications.   Headache     Past Medical  History:  Diagnosis Date   Migraines    Vertigo     There are no problems to display for this patient.   History reviewed. No pertinent surgical history.   OB History     Gravida  1   Para  1   Term      Preterm      AB      Living  1      SAB      IAB      Ectopic      Multiple      Live Births  1           Family History  Problem Relation Age of Onset   Migraines Maternal Grandmother    Diabetes Maternal Uncle    Obesity Maternal Uncle     Social History   Tobacco Use   Smoking status: Never   Smokeless tobacco: Never  Vaping Use   Vaping Use: Never used  Substance Use Topics   Alcohol use: Yes    Alcohol/week: 2.0 standard drinks    Types: 2 Standard drinks or equivalent per week   Drug use: Never    Home Medications Prior to Admission medications   Medication Sig Start Date End Date Taking? Authorizing Provider  ibuprofen (ADVIL) 600 MG tablet Take 1 tablet (600 mg total) by mouth every 6 (six) hours as needed. Patient not taking: No sig reported 09/04/20  Domenick Gong, MD  NIKKI 3-0.02 MG tablet Take 1 tablet by mouth daily. 10/28/20   [provider]  ondansetron (ZOFRAN) 4 MG tablet Take 1 tablet (4 mg total) by mouth every 6 (six) hours. Patient not taking: Reported on 11/26/2020 11/15/20   Couture, Cortni S, PA-C  sertraline (ZOLOFT) 50 MG tablet Take 50 mg by mouth daily. Patient not taking: Reported on 11/26/2020 09/16/20   [provider]    Allergies    Aleve [naproxen]  Review of Systems   Review of Systems  Neurological:  Positive for headaches.  All other systems reviewed and are negative.  Physical Exam Updated Vital Signs BP 113/74 (BP Location: Left Arm)   Pulse 91   Temp 98 F (36.7 C) (Oral)   Resp 16   LMP 10/28/2020   SpO2 98%   Physical Exam Vitals and nursing note reviewed.  Constitutional:      General: She is not in acute distress.    Appearance: She is well-developed and  normal weight.  HENT:     Head: Normocephalic and atraumatic.     Mouth/Throat:     Mouth: Mucous membranes are moist.  Eyes:     Extraocular Movements: Extraocular movements intact.     Right eye: No nystagmus.     Left eye: No nystagmus.     Conjunctiva/sclera: Conjunctivae normal.     Pupils: Pupils are equal, round, and reactive to light.  Cardiovascular:     Rate and Rhythm: Normal rate and regular rhythm.     Heart sounds: No murmur heard. Pulmonary:     Effort: Pulmonary effort is normal. No respiratory distress.     Breath sounds: Normal breath sounds. No wheezing or rales.  Abdominal:     General: There is no distension.     Palpations: Abdomen is soft.     Tenderness: There is no abdominal tenderness. There is no guarding or rebound.  Musculoskeletal:        General: No tenderness. Normal range of motion.     Cervical back: Normal range of motion and neck supple.  Skin:    General: Skin is warm and dry.     Findings: No erythema or rash.  Neurological:     Mental Status: She is alert and oriented to person, place, and time.     Motor: Motor function is intact. No weakness or pronator drift.     Coordination: Coordination is intact.     Gait: Gait is intact.     Deep Tendon Reflexes:     Reflex Scores:      Patellar reflexes are 1+ on the right side and 3+ on the left side.    Comments: Intermittent jerking of the bilateral upper shoulders that is not rhythmic.  These do not appear to be present when the patient is at rest texting on her phone. No Babinski's or clonus.  Psychiatric:        Behavior: Behavior normal.    ED Results / Procedures / Treatments   Labs (all labs ordered are listed, but only abnormal results are displayed) Labs Reviewed - No data to display  EKG None  Radiology MR Brain W and Wo Contrast  Result Date: 11/27/2020 CLINICAL DATA:  Neuro deficit, acute, stroke suspected; headache, generalized weakness, muscle twitching. Additional  history provided: Patient reports muscle twitching and tremors for several months. EXAM: MRI HEAD WITHOUT AND WITH CONTRAST TECHNIQUE: Multiplanar, multiecho pulse sequences of the brain and surrounding structures were  obtained without and with intravenous contrast. CONTRAST:  5.29mL GADAVIST GADOBUTROL 1 MMOL/ML IV SOLN COMPARISON:  No pertinent prior exams available for comparison. FINDINGS: Brain: Cerebral volume is normal. No cortical encephalomalacia is identified. No significant cerebral white matter disease. 11 x 6 mm pineal cyst without suspicious masslike or nodular enhancement. There is no acute infarct. No evidence of an intracranial mass. No chronic intracranial blood products. No extra-axial fluid collection. No midline shift. No pathologic intracranial enhancement is identified. Vascular: Expected proximal arterial flow voids. Skull and upper cervical spine: No focal marrow lesion. Sinuses/Orbits: Visualized orbits show no acute finding. Small bilateral maxillary sinus mucous retention cysts. IMPRESSION: No evidence of acute intracranial abnormality. 11 x 6 mm (likely incidental) pineal cyst. Given the patient's young age, 37-12 month MRI follow-up is recommended to ensure stability. Otherwise unremarkable MRI appearance of the brain. Small bilateral maxillary sinus mucous retention cysts. Electronically Signed   By: Jackey Loge DO   On: 11/27/2020 19:33    Procedures Procedures   Medications Ordered in ED Medications  LORazepam (ATIVAN) injection 0.5 mg (has no administration in time range)  LORazepam (ATIVAN) tablet 1 mg (1 mg Oral Given 11/27/20 1408)    ED Course  I have reviewed the triage vital signs and the nursing notes.  Pertinent labs & imaging results that were available during my care of the patient were reviewed by me and considered in my medical decision making (see chart for details).    MDM Rules/Calculators/A&P                           Patient is a 24 year old  female presenting today with multiple symptoms.  She is here for an MRI today after seeing her NP yesterday with the above symptoms.  Patient has had lab work-up this week which was unrevealing.  Low suspicion for infectious etiology at this time.  Patient's symptoms are not classic for MS.  However there could be a mental health component to patient's symptoms versus other acute neurologic pathology.  MRI pending.  Patient will need neurology follow-up and will most likely need to speak with psychiatry as she does report a lot of stress in her life feeling more anxious and depressed with what is going on with her body.  Calm cooperative and reasonable today.  No suicidal ideation at this time.  7:58 PM MRI showed a pineal cyst but no other acute findings and it appeared incidental based on radiology's read.  Findings discussed with the patient.  She will need a follow-up MRI in 6 to 12 months for surveillance to ensure is not getting larger.  She will follow-up with Dr. Kateri Plummer on Monday to make sure the referral for neurology is in place.  Also discussed with her possibly may need to see psychiatry if all of her neuro evaluation in the future is normal.  Patient appears stable for discharge at this time.  MDM   Amount and/or Complexity of Data Reviewed Tests in the radiology section of CPT: ordered and reviewed Independent visualization of images, tracings, or specimens: yes    Final Clinical Impression(s) / ED Diagnoses Final diagnoses:  Involuntary jerky movements  Pineal gland cyst    Rx / DC Orders ED Discharge Orders     None        Gwyneth Sprout, MD 11/27/20 [phone]

## 2020-11-27 NOTE — ED Notes (Signed)
Patient transported to MRI 

## 2020-11-27 NOTE — Telephone Encounter (Signed)
Called patient with provider recommendations. She voiced understanding. She states she is going to get ready and give the office a call when she is on the way there so we can let NP know.

## 2020-11-27 NOTE — Discharge Instructions (Addendum)
The MRI was normal except for a cyst which based on the radiologist did not have any qualities consistent with tumors or masses.  This is most likely not the cause of your symptoms.  It would be very important to follow-up with neurology and call your doctor on Monday to make sure that it is sorted out.  All the blood work from a few days ago was normal.

## 2020-11-27 NOTE — ED Notes (Signed)
Patient stated that she is still having muscle tremors and will not be able to lay still for an MRI at this time.

## 2020-11-27 NOTE — ED Notes (Signed)
DC instructions reviewed with pt. Pt verbalized understanding.  Pt DC 

## 2020-11-27 NOTE — Progress Notes (Signed)
When transport arrived to bring pt to MRI, she stated she was still too anxious and could not do the MRI.RN notified.

## 2020-11-27 NOTE — Telephone Encounter (Signed)
Should be fine to go to [facility]. I would recommend MRI head. They will likely want to start with CT head wo contrast anyway - not a problem Labs so far showing no major concerns, definitely no cause for her symptoms. Will need imaging.  Can also consider neuro referral that was placed helping to coordinate imaging, but if she would like sooner, she can let me know when she's headed over there and I can call the charge nurse to prompt them for what we'd like to do  Thanks,  Luan Pulling

## 2020-11-27 NOTE — ED Triage Notes (Signed)
Pt reports chronic headaches, sent here for MRI which is already ordered but was told to come through the ER.

## 2020-12-08 ENCOUNTER — Encounter: Payer: Self-pay | Admitting: Neurology

## 2020-12-08 ENCOUNTER — Ambulatory Visit: Payer: Medicaid Other | Admitting: Neurology

## 2020-12-08 ENCOUNTER — Other Ambulatory Visit: Payer: Self-pay

## 2020-12-08 VITALS — BP 114/84 | HR 87 | Ht 60.0 in | Wt 126.0 lb

## 2020-12-08 DIAGNOSIS — R29898 Other symptoms and signs involving the musculoskeletal system: Secondary | ICD-10-CM | POA: Diagnosis not present

## 2020-12-08 DIAGNOSIS — R4586 Emotional lability: Secondary | ICD-10-CM | POA: Diagnosis not present

## 2020-12-08 DIAGNOSIS — G253 Myoclonus: Secondary | ICD-10-CM | POA: Insufficient documentation

## 2020-12-08 MED ORDER — LEVETIRACETAM 250 MG PO TABS: 250.0000 mg | ORAL_TABLET | Freq: Two times a day (BID) | ORAL | 5 refills | Status: AC

## 2020-12-08 NOTE — Progress Notes (Signed)
[facility]  PATIENT: Holly Rice DOB: 10-Oct-1995  REFERRING DOCTOR OR PCP: Janeece Agee, NP SOURCE: Patient, notes from primary care, imaging and lab reports, MRI images personally reviewed.  _________________________________   HISTORICAL  CHIEF COMPLAINT:  Chief Complaint  Patient presents with   New Patient (Initial Visit)    Pt alone, rm 2. 4 mths ago developed nausea, headache and fatigue. This has worsened and she has developed worsening neuro changes. She has involuntary jerking motions. She had a MRI July 29 ER visit. 2 months ago she had OD's on muscle relaxers. Leading up to that point she had developed mood changes.     HISTORY OF PRESENT ILLNESS:  I had the pleasure of seeing your patient, Holly Rice, at [facility] for neurologic consultation regarding her involuntary jerking, headaches and other symptoms.  She is a 25 year old woman who bgan to note musle twitching left > right about 6 weeks ago.   The left shoulder is most involved.   She notes she has more of the spells if stressed or if brought to her attention.   She also twitches more during and after sex.   She denies any trauma or neck pain at the onset of symptoms.      However, she was having mood swings and has more stress with a young baby (age 25).   She will also be moving to [place] within a month.    She had an argument with her boyfriend and was feeling sad leading to a suicide attempt.    She took 15 cyclobenzaprine's (not sure if 5 or 10 mg) 8 weeks ago and  she felt poorly for 48 hours afterwards.    The jerking started 2-3 weeks later.     She has had some hand and wrist weakness x several years and drops items a lot.   If she is laughing, she is more likely to drop an item.   She notes some tingling in the left arm.   .    She denies any bladder issues.   No numbness, weakness or twitching in her legs.   .  Although she has had series of twitches in a row,  she denies any associated with generalkzed twitiching or loss of consciousness.    She will only be in [place] for about 2 more weeks as she is moving to [place].  Images reviewed today: MRI of the brain 11/27/2020 was essentially normal.  Incidental note is made of a pineal cyst without suspicious features.  No evidence of demyelination or stroke  REVIEW OF SYSTEMS: Constitutional: No fevers, chills, sweats, or change in appetite Eyes: No visual changes, double vision, eye pain Ear, nose and throat: No hearing loss, ear pain, nasal congestion, sore throat Cardiovascular: No chest pain, palpitations Respiratory:  No shortness of breath at rest or with exertion.   No wheezes GastrointestinaI: No nausea, vomiting, diarrhea, abdominal pain, fecal incontinence Genitourinary:  No dysuria, urinary retention or frequency.  No nocturia. Musculoskeletal:  No neck pain, back pain Integumentary: No rash, pruritus, skin lesions Neurological: as above Psychiatric: She has had mood swings. Endocrine: No palpitations, diaphoresis, change in appetite, change in weigh or increased thirst Hematologic/Lymphatic:  No anemia, purpura, petechiae. Allergic/Immunologic: No itchy/runny eyes, nasal congestion, recent allergic reactions, rashes  ALLERGIES: Allergies  Allergen Reactions   Aleve [Naproxen] Shortness Of Breath   Migraine Formula [Aspirin-Acetaminophen-Caffeine] Shortness Of Breath and Other (See Comments)    "[employer] Migraine Relief" formula tablets  Banana Hives and Other (See Comments)    Mouth pain and hives on the lips and tongue- no shortness of breath noted   Kiwi Extract Hives and Other (See Comments)    Mouth pain and hives on the lips and tongue- no shortness of breath noted   Peach [Prunus Persica] Hives and Other (See Comments)    Mouth pain and hives on the lips and tongue- no shortness of breath noted   Pineapple Hives and Other (See Comments)    Mouth pain and hives on the  lips and tongue- no shortness of breath noted    HOME MEDICATIONS:  Current Outpatient Medications:    ibuprofen (ADVIL) 600 MG tablet, Take 1 tablet (600 mg total) by mouth every 6 (six) hours as needed., Disp: 20 tablet, Rfl: 0   levETIRAcetam (KEPPRA) 250 MG tablet, Take 1 tablet (250 mg total) by mouth 2 (two) times daily., Disp: 60 tablet, Rfl: 5   NIKKI 3-0.02 MG tablet, Take 1 tablet by mouth daily., Disp: , Rfl:   PAST MEDICAL HISTORY: Past Medical History:  Diagnosis Date   Migraines    Vertigo     PAST SURGICAL HISTORY: No past surgical history on file.  FAMILY HISTORY: Family History  Problem Relation Age of Onset   Migraines Maternal Grandmother    Diabetes Maternal Uncle    Obesity Maternal Uncle     SOCIAL HISTORY:  Social History   Socioeconomic History   Marital status: Significant Other    Spouse name: Not on file   Number of children: Not on file   Years of education: Not on file   Highest education level: Not on file  Occupational History   Not on file  Tobacco Use   Smoking status: Never   Smokeless tobacco: Never  Vaping Use   Vaping Use: Never used  Substance and Sexual Activity   Alcohol use: Yes    Alcohol/week: 2.0 standard drinks    Types: 2 Standard drinks or equivalent per week   Drug use: Never   Sexual activity: Yes    Birth control/protection: OCP  Other Topics Concern   Not on file  Social History Narrative   Pregnant Nov 2020-Aug 2021 - healthy natural birth.   Social Determinants of Health   Financial Resource Strain: Not on file  Food Insecurity: Not on file  Transportation Needs: Not on file  Physical Activity: Not on file  Stress: Not on file  Social Connections: Not on file  Intimate Partner Violence: Not on file     PHYSICAL EXAM  Vitals:   12/08/20 1540  BP: 114/84  Pulse: 87  Weight: 126 lb (57.2 kg)  Height: 5' (1.524 m)    Body mass index is 24.61 kg/m.   General: The patient is  well-developed and well-nourished and in no acute distress  HEENT:  Head is [place]/AT.  Sclera are anicteric.  Funduscopic exam shows normal optic discs and retinal vessels.  Neck: No carotid bruits are noted.  The neck is nontender.  Cardiovascular: The heart has a regular rate and rhythm with a normal S1 and S2. There were no murmurs, gallops or rubs.    Skin: Extremities are without rash or  edema.  Musculoskeletal:  Back is nontender  Neurologic Exam  Mental status: The patient is alert and oriented x 3 at the time of the examination. The patient has apparent normal recent and remote memory, with an apparently normal attention span and concentration ability.   Speech  is normal.  Cranial nerves: Extraocular movements are full. Pupils are equal, round, and reactive to light and accomodation.  Visual fields are full.  Facial symmetry is present. There is good facial sensation to soft touch bilaterally.Facial strength is normal.  Trapezius and sternocleidomastoid strength is normal. No dysarthria is noted.  The tongue is midline, and the patient has symmetric elevation of the soft palate. No obvious hearing deficits are noted.  Motor:  Muscle bulk is normal.   Tone is normal. Strength is  5 / 5 in all 4 extremities.   Sensory: Sensory testing is intact to pinprick, soft touch and vibration sensation in all 4 extremities.  Coordination: Cerebellar testing reveals good finger-nose-finger and heel-to-shin bilaterally.  Gait and station: Station is normal.   Gait is normal. Tandem gait is normal. Romberg is negative.   Reflexes: Deep tendon reflexes are symmetric and normal bilaterally.   Plantar responses are flexor.    DIAGNOSTIC DATA (LABS, IMAGING, TESTING) - I reviewed patient records, labs, notes, testing and imaging myself where available.  Lab Results  Component Value Date   WBC 7.0 11/26/2020   HGB 13.9 11/26/2020   HCT 41.9 11/26/2020   MCV 87.1 11/26/2020   PLT 287  11/26/2020      Component Value Date/Time   NA 138 11/26/2020 1552   K 3.6 11/26/2020 1552   CL 105 11/26/2020 1552   CO2 24 11/26/2020 1552   GLUCOSE 85 11/26/2020 1552   BUN 10 11/26/2020 1552   CREATININE 0.55 11/26/2020 1552   CALCIUM 9.5 11/26/2020 1552   PROT 7.8 11/26/2020 1552   ALBUMIN 4.5 11/26/2020 1552   AST 24 11/26/2020 1552   ALT 31 11/26/2020 1552   ALKPHOS 43 11/26/2020 1552   BILITOT 0.4 11/26/2020 1552   GFRNONAA >60 11/26/2020 1552   No results found for: CHOL, HDL, LDLCALC, LDLDIRECT, TRIG, CHOLHDL No results found for: DJTT0V Lab Results  Component Value Date   VITAMINB12 227 11/26/2020   Lab Results  Component Value Date   TSH 3.208 11/26/2020       ASSESSMENT AND PLAN  Myoclonic jerking - Plan: MR CERVICAL SPINE W WO CONTRAST  Left arm weakness - Plan: MR CERVICAL SPINE W WO CONTRAST, NCV with EMG(electromyography)  Mood swing    In summary, Ms. Livolsi is a 25 year old woman who has had involuntary left shoulder/arm jerking for the past 6 weeks.  She additionally has had mood swings for the past couple months  Etiology of her symptoms is on clear.  Her exam was relatively normal except for occasional jerks involving the left shoulder.  Larger jerks might be bilateral.  I am most concerned about the possibility of propriospinal myoclonus from a cervical spinal cord myelopathy.  We need to check an MRI of the cervical spine to rule out intrinsic or extrinsic compression.  She also has weakness in the left arm that could be coming from the spinal cord or due to mononeuropathies in the left arm.  She has no weakness.  We will check an NCV/EMG study. I will prescribe levetiracetam 250 mg twice a day we can increase this if well-tolerated.  This might help her symptoms of due to myoclonus.  If she does have a spinal cord scar causing propriospinal myoclonus and Keppra does not help consider a benzodiazepine.  However, I will be reluctant to do so given  her cyclobenzaprine overdose. We discussed her mood swings and recent purposeful cyclobenzaprine overdose.  I have advised her to  seek emergency care with behavioral health if she has another episode of suicidal ideation.  She is going to be moving in 2 weeks and I have advised her to establish care with psychiatry in [place].  She is not interested in starting an antidepressant at this time. I will see her when she returns for the NCV/EMG study and she should call sooner if new or worsening neurologic symptoms.  Thank you for asking me to see Ms. Hosie Poisson.  Please let me know if I can be of further assistance with her or other patients in the future.  Denasia Venn A. Epimenio Foot, MD, [facility] 12/08/2020, 6:00 PM Certified in Neurology, Clinical Neurophysiology, Sleep Medicine and Neuroimaging  [facility] [address] [place], [place] [address] [phone]

## 2020-12-09 ENCOUNTER — Telehealth: Payer: Self-pay | Admitting: Neurology

## 2020-12-09 NOTE — Telephone Encounter (Signed)
m[facility] healthy blue pending can take up to 14 business days

## 2020-12-14 NOTE — Telephone Encounter (Signed)
mcd healthy blue Berkley Harvey: [id number] (exp. 12/09/20 to 02/07/21) order sent to GI. They will reach out to the patient to schedule.

## 2020-12-23 ENCOUNTER — Other Ambulatory Visit: Payer: Self-pay

## 2020-12-23 ENCOUNTER — Ambulatory Visit
Admission: RE | Admit: 2020-12-23 | Discharge: 2020-12-23 | Disposition: A | Discharge status: Home / Self Care | Payer: Medicaid Other | Source: Ambulatory Visit | Attending: Neurology | Admitting: Neurology

## 2020-12-23 DIAGNOSIS — G253 Myoclonus: Secondary | ICD-10-CM | POA: Diagnosis not present

## 2020-12-23 DIAGNOSIS — R29898 Other symptoms and signs involving the musculoskeletal system: Secondary | ICD-10-CM

## 2020-12-23 MED ORDER — GADOBENATE DIMEGLUMINE 529 MG/ML IV SOLN
11.0000 mL | Freq: Once | INTRAVENOUS | Status: AC | PRN
  Administered 2020-12-23: 11 mL via INTRAVENOUS

## 2020-12-24 ENCOUNTER — Ambulatory Visit (INDEPENDENT_AMBULATORY_CARE_PROVIDER_SITE_OTHER): Payer: Medicaid Other | Admitting: Diagnostic Neuroimaging

## 2020-12-24 ENCOUNTER — Encounter: Payer: Medicaid Other | Admitting: Diagnostic Neuroimaging

## 2020-12-24 DIAGNOSIS — Z0289 Encounter for other administrative examinations: Secondary | ICD-10-CM

## 2020-12-24 DIAGNOSIS — R29898 Other symptoms and signs involving the musculoskeletal system: Secondary | ICD-10-CM | POA: Diagnosis not present

## 2020-12-28 NOTE — Procedures (Signed)
   [facility]  NCS (NERVE CONDUCTION STUDY) WITH EMG (ELECTROMYOGRAPHY) REPORT   STUDY DATE: 12/24/20 PATIENT NAME: Holly Rice DOB: 1996-02-01 MRN: [id number]  ORDERING CLINICIAN: Despina Arias, MD PhD   TECHNOLOGIST: Durenda Age ELECTROMYOGRAPHER: Glenford Bayley. Damyon Mullane, MD  CLINICAL INFORMATION: 25 year old female with muscle twitching.  FINDINGS: NERVE CONDUCTION STUDY:  Left median and left ulnar motor responses are normal.  Left ulnar F wave latency is normal.  Left median and left ulnar sensory responses are normal.   NEEDLE ELECTROMYOGRAPHY:  Needle examination of left upper extremity deltoid and flexor carpi radialis was normal.  Patient had continued involuntary movements of left upper extremity and therefore further needle EMG was deferred.     IMPRESSION:   Normal study.  No electrodiagnostic evidence of large fiber neuropathy or myopathy at this time.   INTERPRETING PHYSICIAN:  Suanne Marker, MD Certified in Neurology, Neurophysiology and Neuroimaging  [facility] [address] [place], [place] [address] [phone]  [facility]    Nerve / Sites Muscle Latency Ref. Amplitude Ref. Rel Amp Segments Distance Velocity Ref. Area    ms ms mV mV %  cm m/s m/s mVms  L Median - APB     Wrist APB 2.5 ?4.4 7.0 ?4.0 100 Wrist - APB 7   29.1     Upper arm APB 5.7  6.8  96.4 Upper arm - Wrist 18 56 ?49 29.6  L Ulnar - ADM     Wrist ADM 2.2 ?3.3 10.1 ?6.0 100 Wrist - ADM 7   37.7     B.Elbow ADM 4.5  9.2  90.7 B.Elbow - Wrist 16 70 ?49 32.4     A.Elbow ADM 6.0  9.8  106 A.Elbow - B.Elbow 10 65 ?49 35.4         SNC    Nerve / Sites Rec. Site Peak Lat Ref.  Amp Ref. Segments Distance    ms ms V V  cm  L Median - Orthodromic (Dig II, Mid palm)     Dig II Wrist 2.4 ?3.4 30 ?10 Dig II - Wrist 13  L Ulnar - Orthodromic, (Dig V, Mid palm)     Dig V Wrist 2.2 ?3.1 19 ?5 Dig V - Wrist 74         F  Wave    Nerve F Lat  Ref.   ms ms  L Ulnar - ADM 21.9 ?32.0       EMG Summary Table    Spontaneous MUAP Recruitment  Muscle IA Fib PSW Fasc Other Amp Dur. Poly Pattern  L. Deltoid Normal None None None _______ Normal Normal Normal Normal  L. Flexor carpi radialis Normal None None None _______ Normal Normal Normal Normal

## 2022-05-06 IMAGING — DX DG FINGER INDEX 2+V*R*
3 series · 3 of 3 positions shown · non-contrast
Comparison: None

CLINICAL DATA: Cat bite, rule out retained foreign body.

EXAM:
RIGHT INDEX FINGER 2+V

[finger pa]
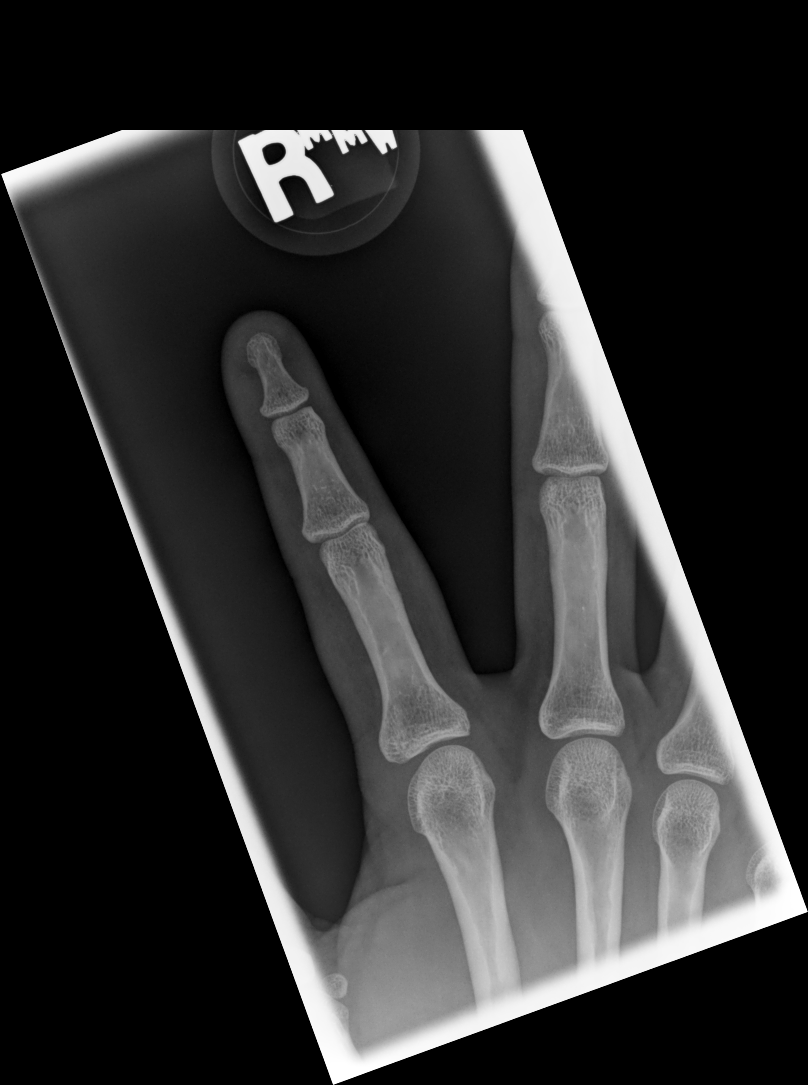

[finger mlo]
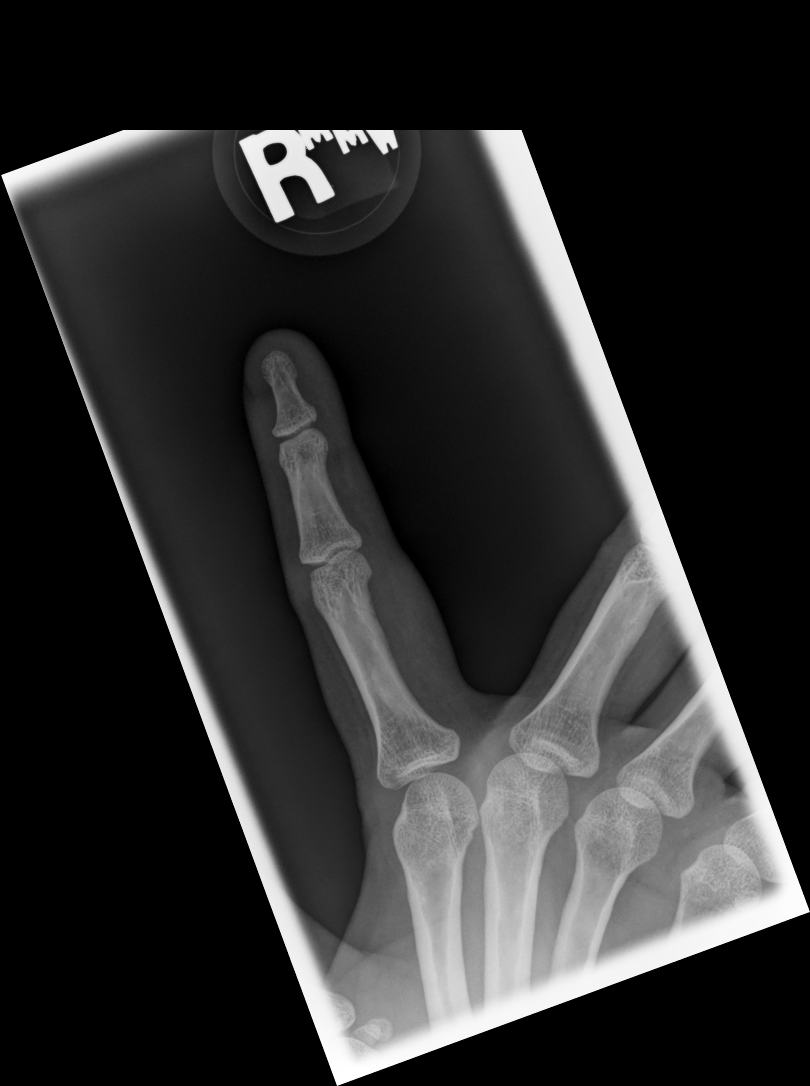

[2. finger lat]
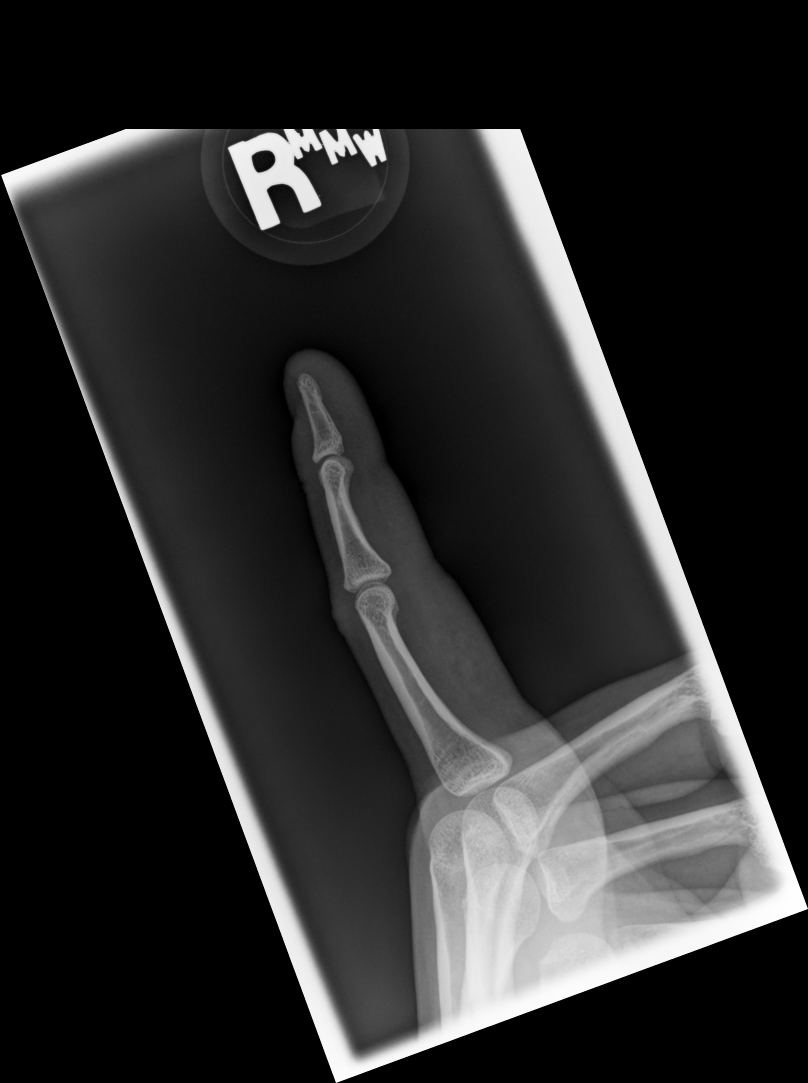

[3 of 3 positions shown; findings below may reference images not displayed]

FINDINGS: No radiopaque foreign body in the soft tissues. No acute bone
abnormality or sign of dislocation. Soft tissue swelling over the
volar aspect of the finger, particularly true over the proximal
phalanx of the index finger.
IMPRESSION: Soft tissue swelling without acute bone abnormality or radiopaque
foreign body.
